# Patient Record
Sex: Female | Born: 1970 | Race: White | Hispanic: No
Health system: Midwestern US, Academic
[De-identification: ages and names within clinical notes are randomized; demographics above are authoritative.]

---

## 2019-10-11 ENCOUNTER — Encounter: Admit: 2019-10-11 | Discharge: 2019-10-11 | Payer: MEDICARE

## 2019-10-11 NOTE — Telephone Encounter
Patient contacted clinic looking for a new neurologist for her MS as Dr. Roseanne Reno is leaving his practice.        Previous neurologist:   Dr .Roseanne Reno - [facility] (records requested)  Dr. Chryl Heck - [facility] (records requested)    Imaging: requested from [facility]       Confirmed appointment for 10/26/2019 at 12:40pm.  Also confirmed that patient aware of clinic location, visitor policy, and mask requirement.

## 2021-02-28 ENCOUNTER — Encounter: Admit: 2021-02-28 | Discharge: 2021-02-28 | Payer: MEDICARE

## 2021-03-06 ENCOUNTER — Encounter: Admit: 2021-03-06 | Discharge: 2021-03-06 | Payer: MEDICARE

## 2021-03-06 ENCOUNTER — Ambulatory Visit: Admit: 2021-03-06 | Discharge: 2021-03-07 | Payer: MEDICARE

## 2021-03-06 DIAGNOSIS — G35 Multiple sclerosis: Secondary | ICD-10-CM

## 2021-03-23 ENCOUNTER — Encounter: Admit: 2021-03-23 | Discharge: 2021-03-23 | Payer: MEDICARE

## 2021-03-23 ENCOUNTER — Ambulatory Visit: Admit: 2021-03-23 | Discharge: 2021-03-23 | Payer: MEDICARE

## 2021-03-23 DIAGNOSIS — G35 Multiple sclerosis: Secondary | ICD-10-CM

## 2021-03-23 DIAGNOSIS — L723 Sebaceous cyst: Secondary | ICD-10-CM

## 2021-03-23 MED ORDER — FLUCONAZOLE 150 MG PO TAB
150 mg | ORAL_TABLET | Freq: Once | ORAL | 4 refills | 3.00000 days | Status: AC
Start: 2021-03-23 — End: ?

## 2021-03-23 MED ORDER — CEPHALEXIN 500 MG PO CAP
500 mg | ORAL_CAPSULE | Freq: Three times a day (TID) | ORAL | 0 refills | Status: AC
Start: 2021-03-23 — End: ?

## 2021-03-23 NOTE — Progress Notes
Date of Service: 03/23/2021    Subjective:             Amy Shaw is a 50 y.o. female.    History of Present Illness  Here for removal of right cheek sebaceous cyst.      Review of Systems   Constitutional: Negative.    HENT: Negative.    Eyes: Negative.    Respiratory: Negative.    Cardiovascular: Negative.    Gastrointestinal: Negative.    Endocrine: Negative.    Genitourinary: Negative.    Musculoskeletal: Negative.    Skin: Negative.    Allergic/Immunologic: Negative.    Neurological: Negative.    Hematological: Negative.    Psychiatric/Behavioral: Negative.          Objective:         ? baclofen (LIORESAL) 10 mg tablet    ? calcium carbonate (CALCIUM 600 PO)    ? cetirizine (ZYRTEC) 10 mg tablet cetirizine hydrochloride 10 MG Oral Tablet [Zyrtec]   ? clonazePAM (KLONOPIN) 0.5 mg tablet    ? dalfampridine (AMPYRA) 10 mg tablet    ? dextroamphetamine sulfate (DEXTROSTAT) 10 mg tablet    ? fluticasone propionate (FLONASE) 50 mcg/actuation nasal spray, suspension    ? medroxyPROGESTERone (DEPO-PROVERA) 150 mg/mL injection    ? METOPROLOL TARTRATE PO    ? naproxen (NAPROSYN EC) 500 mg tablet    ? naratriptan (AMERGE) 2.5 mg tablet    ? nystatin (MYCOSTATIN) 100,000 unit/g topical cream nystatin 100000 UNT/ML Topical Cream   ? nystatin (NYSTOP) 100,000 unit/g topical powder nystatin   ? oxyCODONE SR (OXYCONTIN) 30 mg ER tablet oxycodone   ? peginterferon beta-1a (PLEGRIDY) 125 mcg/0.5 mL injectable PEN    ? terbinafine HCL (LAMISIL) 250 mg tablet TAKE 1 TABLET BY MOUTH EVERY DAY FOR FUNGAL INFECTION     Vitals:    03/23/21 1549   BP: 135/78   BP Source: Arm, Left Upper   Pulse: 67   Temp: 36.2 ?C (97.2 ?F)   PainSc: Five   Weight: 81.2 kg (179 lb)   Height: 162.6 cm (5' 4)     Body mass index is 30.73 kg/m?Marland Kitchen     Physical Exam  NAD  1cm Right medial cheek sebaceous cyst just lateral the NLF, punctum present    [facility]  Attending Physician: Agustin Cree Dx: Cheek lesion  Postop DX: Same    Procedure: Removal of cheek lesion    Anesthesia: Local anesthetic    EBL: Minimal    Specimen: Right cheek lesion    Complications: None    Findings: Right cheek sebaceous cyst    Indications for procedure: Amy Shaw is a 50 y.o. female with a history of right cheek lesion who comes in today for removal of right cheek cyst. A PARQ session was held and consent was obtained for the planned procedure.    Description of Procedure: The patient was placed into a lounge-chair position in the clinic procedure room. At that time, a time out was performed and all in attendance, including the patient, agreed to commence the operation. After preoperative marking was performed, a bicarbonate-buffered 50:50 mixture of 1/4 percent bupivicaine and 1% lidocaine with 1:100000 of epinephrine was infiltrated into the surgical area. Approximately 5cc's were used for this purpose. The patient was then prepped and draped in a sterile fashion.    An ellipical incision was made around the punctum. Dissection was carried around the sebaceous cyst. The cyst was  dissected free from the underlying structures. The cyst was removed and transferred to the pathology. Hemostasis was achieved. Multiple 5-0 PDS sutures were placed. The skin was closed with 5-0 Fast in a running fashion.     At the completion of the procedure, Vaseline ointment was applied to the area and a dressing was placed. Postoperative wound care instructions given and followup appointment set up. The patient was discharged home having tolerated the procedure well without any complications.       Assessment and Plan:  Tolerated removal of right cheek lesion, consistent with sebaceous cyst. Post-op care reviewed extensively. Return precautions discussed. Discussed follow up with local provider should she have any issues since she lives far away. Follow up as needed.

## 2022-02-26 IMAGING — MR MR cervical spine wo/w con
1 series · 10 of 10 positions shown · non-contrast
Comparison: None.

EXAM: MRI cervical spine with and without contrast.
REASON FOR EXAM: Multiple sclerosis
TECHNIQUE: Multiplanar, multisequence MRI of the cervical spines obtained with and without contrast.

[Series 1201: T2 · 10 of 30 frames shown]
[frame 1/30]
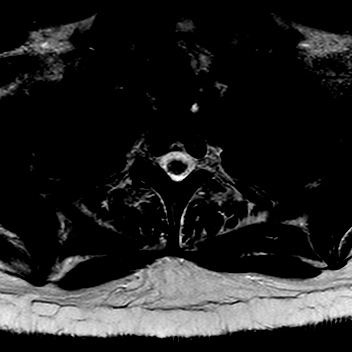
[frame 4/30]
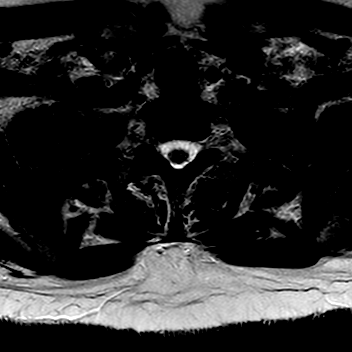
[frame 7/30]
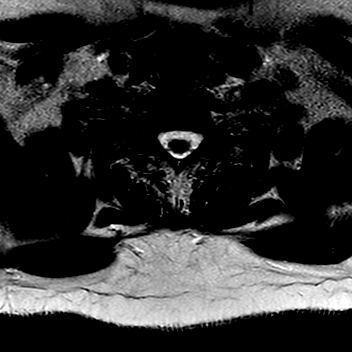
[frame 10/30]
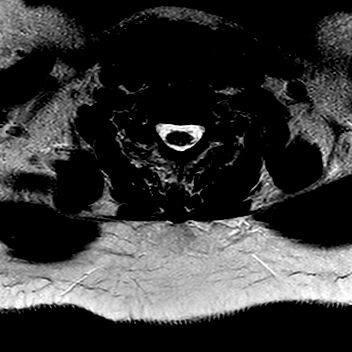
[frame 13/30]
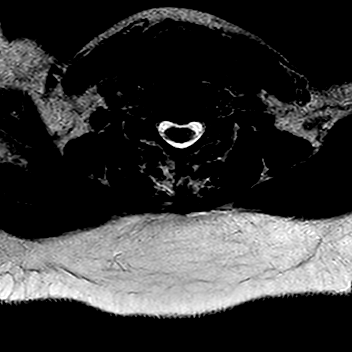
[frame 17/30]
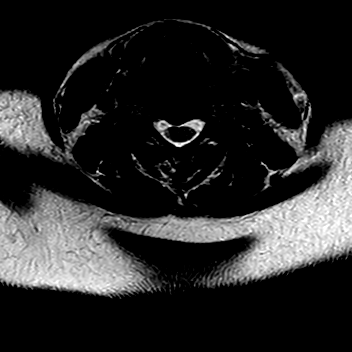
[frame 20/30]
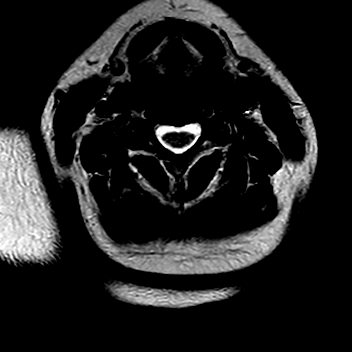
[frame 23/30]
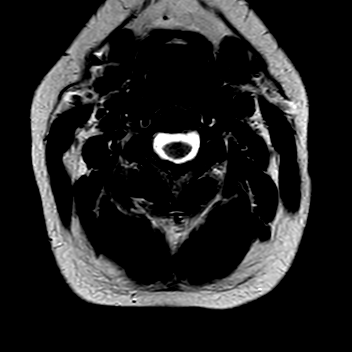
[frame 26/30]
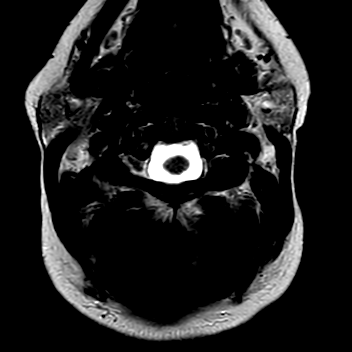
[frame 30/30]
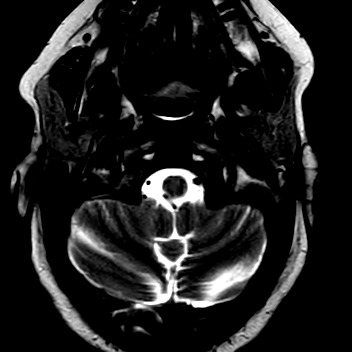

[10 of 10 positions shown; findings below may reference images not displayed]

FINDINGS: The static alignment is anatomic. The vertebral body heights and disc spaces are
well-maintained. The bone marrow signal is normal. No evidence of fracture.
Scattered white matter hyperintensities are seen throughout the cervical cord most prominent at C1
to the left hemicord and possibly C6-7. No pathologic enhancement appreciated. Visualized brain and
spinal cord demonstrate a normal intrinsic signal and morphology. The paravertebral stress soft
tissues unremarkable.
A 10 mm nodule seen involving the left lobe of the thyroid gland incompletely evaluated on this
study. Ultrasound recommended to further characterize.
C2-3: Uncovertebral and facet arthropathy is identified without significant stenosis
C3-4: Uncovertebral and facet arthropathy is identified resulting in minimal bilateral foraminal
narrowing
C4-5: A moderate central disc protrusion with uncovertebral facet arthropathy is demonstrated
resulting in minimal spinal canal narrowing
C5-6: Small central protrusion with uncovertebral and facet arthropathy is identified resulting in
minimal spinal canal narrowing
C6-7: No significant stenosis
IMPRESSION: 1. Degenerative changes as detailed above most prominent at C4-5.
2. Scattered white matter hyperintensities throughout the cervical cord without pathologic
enhancement consistent with patient's history of multiple sclerosis.
3. No acute osseous or ligamentous abnormalities.

## 2022-02-26 IMAGING — MR MR head/brain wo/w con
1 series · 10 of 10 positions shown · IV contrast (agent unspecified)
Comparison: None

BRAINWW
REASON FOR EXAM: Multiple sclerosis .
TECHNIQUE: Routine pre and postcontrast enhanced multiplanar, multisequence MRI of the head was
obtained.

[Series 1901: FLAIR · 10 of 328 frames shown]
[frame 1/328]
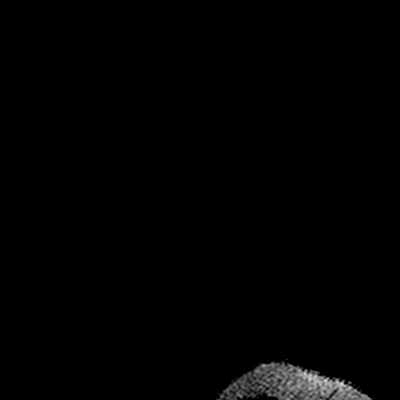
[frame 37/328]
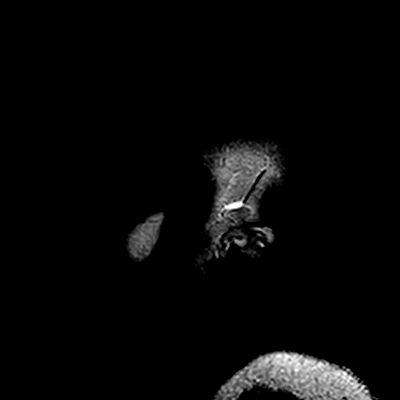
[frame 73/328]
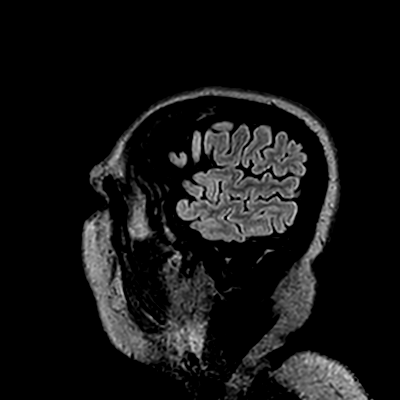
[frame 110/328]
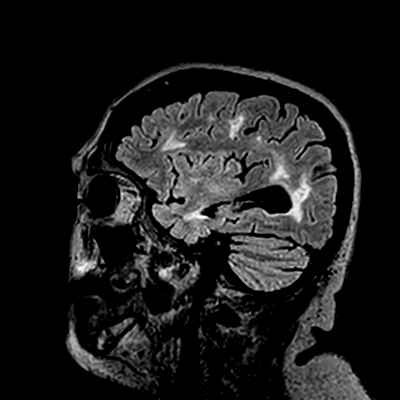
[frame 146/328]
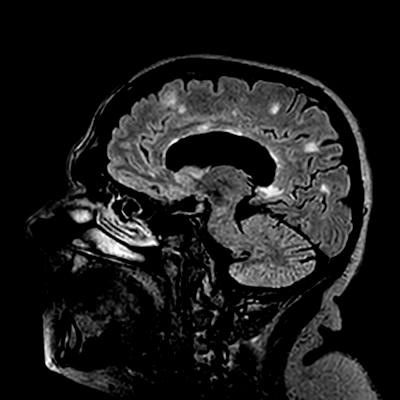
[frame 182/328]
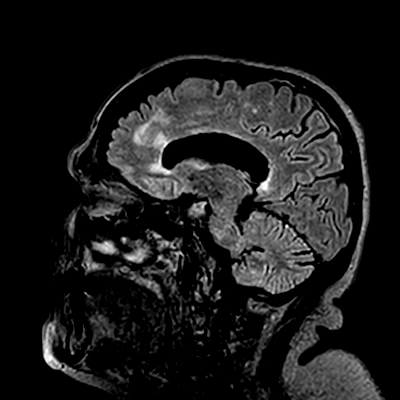
[frame 219/328]
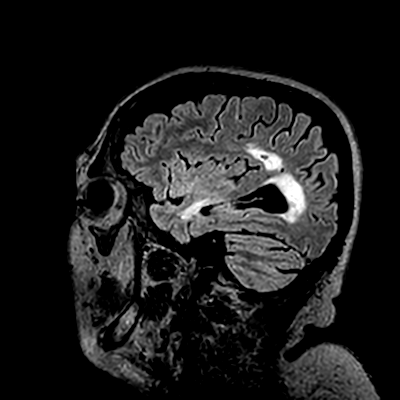
[frame 255/328]
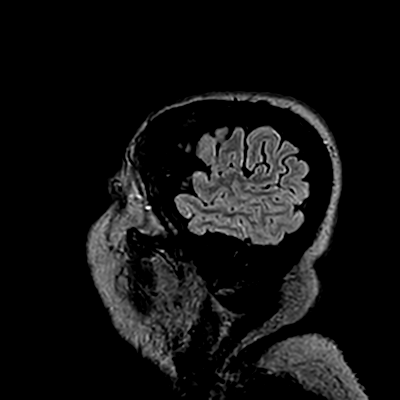
[frame 291/328]
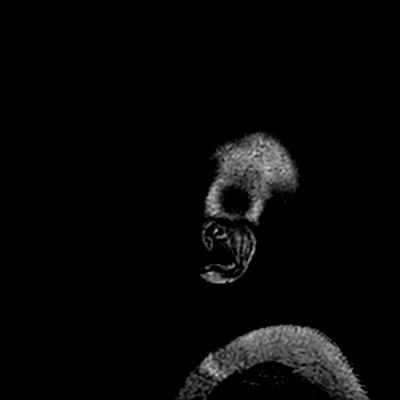
[frame 328/328]
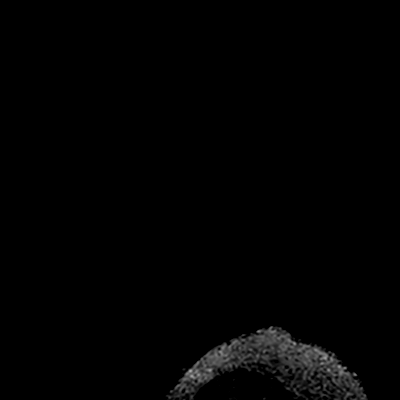

[10 of 10 positions shown; findings below may reference images not displayed]

FINDINGS: The ventricles and the cortical sulci are age-appropriate.  Extensive periventricular
demyelinating plaques are seen in a symmetric distribution.  There are also foci seen within the
deep white matter.
A prominent demyelinating plaque is seen in the deep white matter of the left frontal lobe.  There
is also a demyelinating plaque seen in the left midbrain measuring 8 mm.
There is no midline shift or mass effect identified.
There is no acute territorial ischemia.
No intraparenchymal or extraaxial hemorrhage or fluid collection is identified.    No focal
intra-axial mass is present.  No enhancing lesions.  No evidence for active demyelination.
The midline craniocervical anatomy is unremarkable. The major expected intracranial flow voids are
seen. There are no focal calvarial lesions.
Visualized paranasal sinuses are clear.
IMPRESSION: 1.  Extensive MS plaques throughout the brain, primarily in a periventricular distribution.  There
is one plaque seen in the left midbrain.
2.  No actively demyelinating plaques are seen.

## 2022-02-26 IMAGING — MR MR thoracic spine wo/w con
1 series · 10 of 10 positions shown · non-contrast
Comparison: None.

EXAM: MRI brain thoracic spine with and without contrast.
REASON FOR EXAM: Multiple sclerosis
TECHNIQUE: Multiplanar, multisequence MRI of the thoracic spine was obtained with and without
contrast.

[Series 201: t2_sag_count · 10 of 22 frames shown]
[frame 1/22]
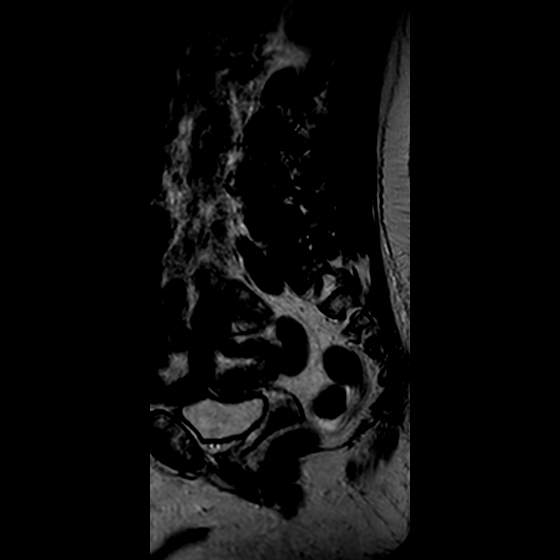
[frame 3/22]
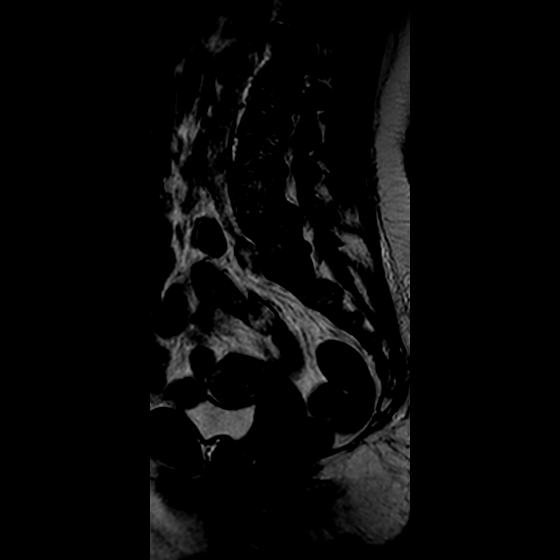
[frame 5/22]
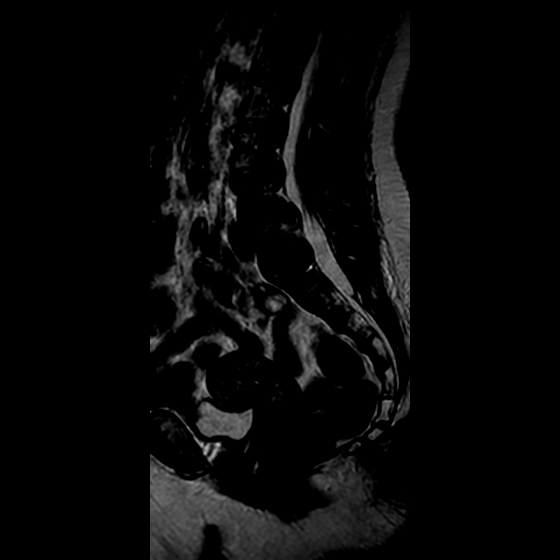
[frame 8/22]
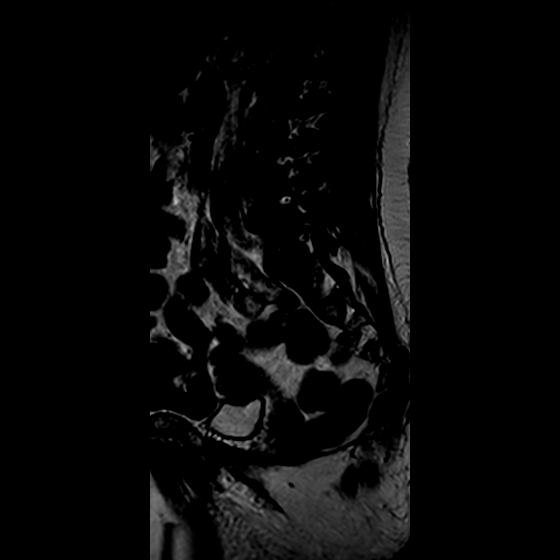
[frame 10/22]
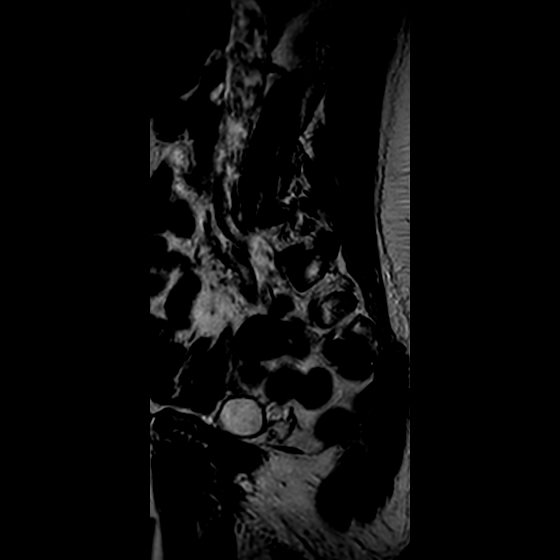
[frame 12/22]
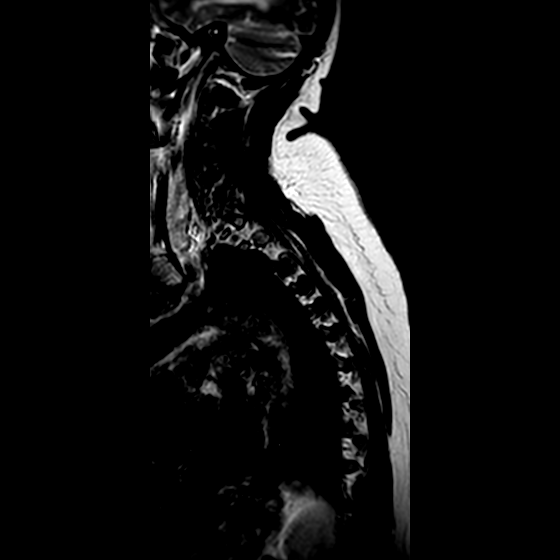
[frame 15/22]
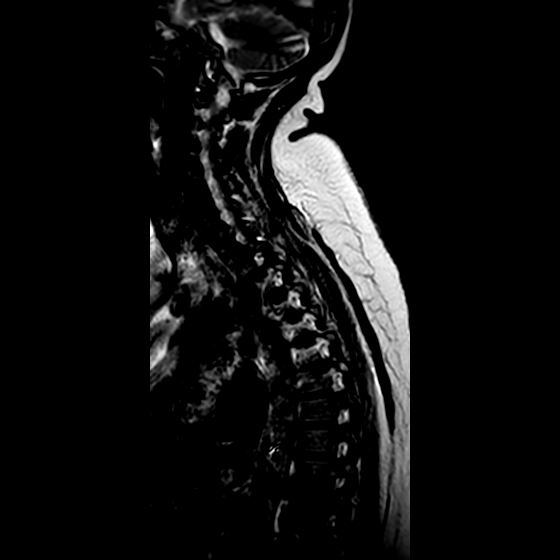
[frame 17/22]
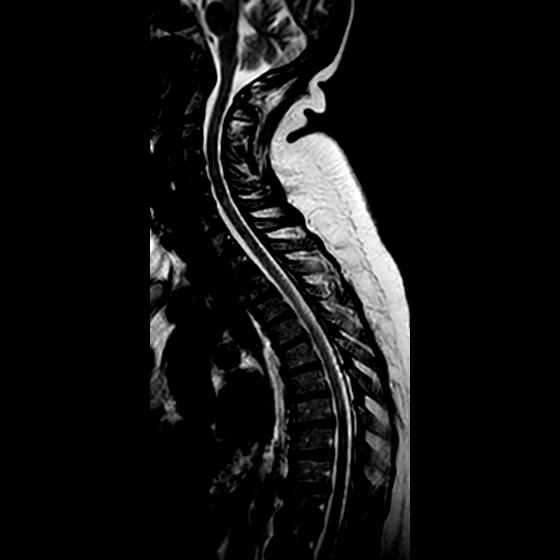
[frame 19/22]
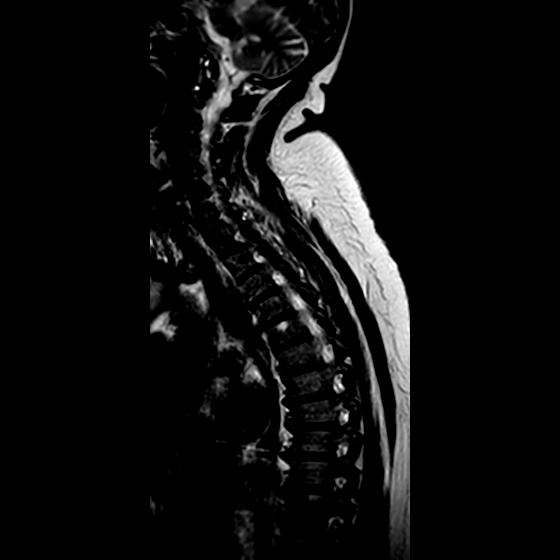
[frame 22/22]
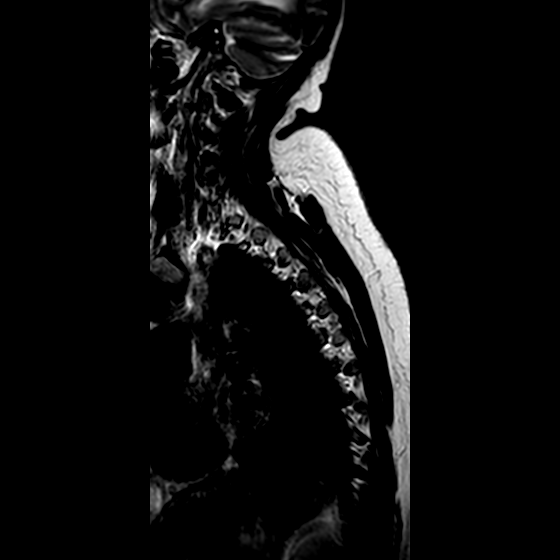

[10 of 10 positions shown; findings below may reference images not displayed]

FINDINGS: A minimal S-shaped curvature seen centered in the midthoracic region. Otherwise the static
alignment is anatomic.
The vertebral body heights are well-maintained. The bone marrow signal is normal. No evidence of
fracture.
Degenerative changes are seen with disc height loss and mixed low T2 signal most prominent in the
midthoracic region.
Scattered white matter hyperintensities are seen throughout the thoracic cord consistent patient's
history of multiple sclerosis. No obvious pathologic enhancement appreciated.
No significant spinal canal or foraminal stenosis.
T5-6: A small to moderate right central protrusion is identified without significant stenosis or
mass effect.
IMPRESSION: 1. Scattered white matter hyperintensities seen throughout the thoracic cord consistent with
patient's history of multiple sclerosis. No pathologic enhancement appreciated.
2. Degenerative changes as detailed above without significant spinal canal foraminal stenosis.
3. No acute osseous abnormalities.
